# Patient Record
Sex: Female | Born: 1975 | Race: White | Hispanic: Yes | Marital: Married | Smoking: Never smoker
Health system: Southern US, Community
[De-identification: ages and names within clinical notes are randomized; demographics above are authoritative.]

## PROBLEM LIST (undated history)

## (undated) ENCOUNTER — Emergency Department (HOSPITAL_COMMUNITY): Discharge status: Against Medical Advice | Payer: Self-pay

## (undated) DIAGNOSIS — Z789 Other specified health status: Secondary | ICD-10-CM

## (undated) HISTORY — PX: OTHER SURGICAL HISTORY: SHX169

---

## 2012-05-28 ENCOUNTER — Encounter (HOSPITAL_COMMUNITY): Payer: Self-pay | Admitting: *Deleted

## 2012-06-02 ENCOUNTER — Encounter (HOSPITAL_COMMUNITY): Payer: Self-pay

## 2012-06-02 ENCOUNTER — Other Ambulatory Visit: Payer: Self-pay | Admitting: Obstetrics and Gynecology

## 2012-06-02 ENCOUNTER — Ambulatory Visit (HOSPITAL_COMMUNITY)
Admission: RE | Admit: 2012-06-02 | Discharge: 2012-06-02 | Disposition: A | Discharge status: Home / Self Care | Payer: Self-pay | Source: Ambulatory Visit | Attending: Obstetrics and Gynecology | Admitting: Obstetrics and Gynecology

## 2012-06-02 VITALS — BP 96/54 | Temp 97.9°F | Ht 59.0 in | Wt 118.6 lb

## 2012-06-02 DIAGNOSIS — N6321 Unspecified lump in the left breast, upper outer quadrant: Secondary | ICD-10-CM

## 2012-06-02 DIAGNOSIS — Z1239 Encounter for other screening for malignant neoplasm of breast: Secondary | ICD-10-CM

## 2012-06-02 NOTE — Patient Instructions (Signed)
Taught patient how to perform BSE. Patient did not need a Pap smear today due to last Pap smear was 05/15/2012. Let her know [facility]CP will cover Pap smears every 3 years unless has a history of abnormal Pap smears. Referred patient to the [facility] for diagnostic mammogram and possible left breast ultrasound. Appointment scheduled for Monday, June 15, 2012 at 1010. Patient aware of appointment and will be there. Patient verbalized understanding.

## 2012-06-02 NOTE — Addendum Note (Signed)
Encounter addended by: Saintclair Halsted, RN on: 06/02/2012  4:35 PM<BR>     Documentation filed: Visit Diagnoses

## 2012-06-02 NOTE — Progress Notes (Addendum)
Patient complained of upper outer quadrant tender breast lump. Patient rates pain at a 5 out of 10.  Pap Smear:    Pap smear not completed today. Last Pap smear was 05/15/2012 at the [facility] and normal. Per patient no history of abnormal Pap smears. No Pap smear results in EPIC.  Physical exam: Breasts Breasts symmetrical. No skin abnormalities bilateral breasts. No nipple retraction right breast. Left nipple retracted per patient is normal for her. No nipple discharge bilateral breasts. No lymphadenopathy. No lumps palpated right breast. Palpated lump within the left breast at 2 o'clock 2 cm from the areola.  Patient complained of tenderness when palpated lump. Referred patient to the [facility] for diagnostic mammogram and possible left breast ultrasound. Appointment scheduled for Monday, June 15, 2012 at 1010.     Pelvic/Bimanual No Pap smear completed today since last Pap smear was 05/15/2012. Pap smear not indicated per BCCCP guidelines.

## 2012-06-15 ENCOUNTER — Ambulatory Visit
Admission: RE | Admit: 2012-06-15 | Discharge: 2012-06-15 | Disposition: A | Discharge status: Home / Self Care | Payer: No Typology Code available for payment source | Source: Ambulatory Visit | Attending: Obstetrics and Gynecology | Admitting: Obstetrics and Gynecology

## 2012-06-15 DIAGNOSIS — N6321 Unspecified lump in the left breast, upper outer quadrant: Secondary | ICD-10-CM

## 2012-10-19 ENCOUNTER — Encounter (HOSPITAL_COMMUNITY): Payer: Self-pay | Admitting: Emergency Medicine

## 2012-10-19 ENCOUNTER — Emergency Department (HOSPITAL_COMMUNITY): Payer: Self-pay

## 2012-10-19 ENCOUNTER — Emergency Department (HOSPITAL_COMMUNITY)
Admission: EM | Admit: 2012-10-19 | Discharge: 2012-10-19 | Disposition: A | Discharge status: Home / Self Care | Payer: Self-pay | Attending: Emergency Medicine | Admitting: Emergency Medicine

## 2012-10-19 DIAGNOSIS — Z3202 Encounter for pregnancy test, result negative: Secondary | ICD-10-CM | POA: Insufficient documentation

## 2012-10-19 DIAGNOSIS — N83209 Unspecified ovarian cyst, unspecified side: Secondary | ICD-10-CM | POA: Insufficient documentation

## 2012-10-19 LAB — WET PREP, GENITAL: Yeast Wet Prep HPF POC: NONE SEEN

## 2012-10-19 LAB — URINE MICROSCOPIC-ADD ON

## 2012-10-19 LAB — URINALYSIS, ROUTINE W REFLEX MICROSCOPIC
Bilirubin Urine: NEGATIVE
Hgb urine dipstick: NEGATIVE
Protein, ur: NEGATIVE mg/dL
Urobilinogen, UA: 0.2 mg/dL (ref 0.0–1.0)

## 2012-10-19 MED ORDER — IBUPROFEN 600 MG PO TABS: 600.0000 mg | ORAL_TABLET | Freq: Four times a day (QID) | ORAL | Status: DC | PRN

## 2012-10-19 MED ORDER — OXYCODONE-ACETAMINOPHEN 5-325 MG PO TABS
1.0000 | ORAL_TABLET | Freq: Once | ORAL | Status: AC
  Administered 2012-10-19: 1 via ORAL
  Filled 2012-10-19: qty 1

## 2012-10-19 NOTE — ED Notes (Addendum)
1610  Received report and introduced self to the pt.  2035  MD in room with female chaperone for pelvic exam

## 2012-10-19 NOTE — ED Notes (Signed)
Pt states through translator that she has requested to have her IUD taken out in the past, but was told "they" could not because she might get pregnant.

## 2012-10-19 NOTE — ED Notes (Signed)
Patient transported to CT 

## 2012-10-19 NOTE — ED Notes (Signed)
Interpreter phone used: IUD placed in [place] 5 years ago. For 3 days intense pain in pelvis and bleeding. Reports it was more abundant in last few days and bleeding has tapered off. Denies possibility she could be pregnant.

## 2012-10-19 NOTE — ED Provider Notes (Signed)
I saw and evaluated the patient, reviewed the resident's note and I agree with the findings and plan.  Pt with non surgical abd, f/u given  Toy Baker, MD 10/19/12 2118

## 2012-10-19 NOTE — ED Provider Notes (Signed)
I saw and evaluated the patient, reviewed the resident's note and I agree with the findings and plan.  Apolonio Cutting T Jon Lall, MD 10/19/12 2305 

## 2012-10-19 NOTE — ED Notes (Signed)
Patient returned from CT

## 2012-10-19 NOTE — ED Provider Notes (Signed)
History    CSN: [id number] Arrival date & time 10/19/12  1349  First MD Initiated Contact with Patient 10/19/12 1855     Chief Complaint  Patient presents with  . Abdominal Pain   (Consider location/radiation/quality/duration/timing/severity/associated sxs/prior Treatment) Patient is a 37 y.o. female presenting with abdominal pain. The history is provided by the patient. The history is limited by a language barrier. A language interpreter was used.  Abdominal Pain Associated symptoms include abdominal pain. Associated symptoms comments: 37yo female who had an IUD placed 5 years ago in [place].  Has had regular annual check ups, including a normal one by her OB/GYN 2 months ago. She presents with abd pain for the past 3 days, along with vaginal bleeding.  States the pain is burning sensation along BLQ, constant without radiation.  She has had dysuria as well.  No vaginal discharge.  She uses 1-2 pads per day, when she normally does not bleed at all.  She denies any new unprotected sex with new partners.  ROS is otherwise negative..  10 Systems reviewed and are negative for acute change except as noted in the HPI.  No past medical history on file. No past surgical history on file. No family history on file. History  Substance Use Topics  . Smoking status: Not on file  . Smokeless tobacco: Not on file  . Alcohol Use: Not on file   OB History   Grav Para Term Preterm Abortions TAB SAB Ect Mult Living                 Review of Systems  Gastrointestinal: Positive for abdominal pain.    Allergies  Review of patient's allergies indicates no known allergies.  Home Medications  No current outpatient prescriptions on file. BP 93/64  Pulse 65  Temp(Src) 97.6 F (36.4 C) (Oral)  Resp 18  SpO2 100%  LMP 09/29/2012 Physical Exam  Nursing note and vitals reviewed. Constitutional: She is oriented to person, place, and time. She appears well-developed and well-nourished. No distress.   HENT:  Head: Normocephalic and atraumatic.  Eyes: Conjunctivae and EOM are normal. Pupils are equal, round, and reactive to light. No scleral icterus.  Neck: Normal range of motion. Neck supple. No JVD present. No tracheal deviation present. No thyromegaly present.  Cardiovascular: Normal rate, regular rhythm and normal heart sounds.  Exam reveals no gallop and no friction rub.   No murmur heard. Pulmonary/Chest: Effort normal and breath sounds normal. No respiratory distress. She has no wheezes. She exhibits no tenderness.  Abdominal: Soft. Bowel sounds are normal. She exhibits no distension and no mass. There is no hepatosplenomegaly. There is tenderness in the right lower quadrant, suprapubic area and left lower quadrant. There is guarding. There is no rigidity, no rebound, no CVA tenderness, no tenderness at McBurney's point and negative Murphy's sign. No hernia.  Genitourinary: Vagina normal. There is no rash, tenderness, lesion or injury on the right labia. There is no rash, tenderness, lesion or injury on the left labia. No vaginal discharge found.  IUD strings present, coming from the cervical os  Musculoskeletal: Normal range of motion. She exhibits no edema and no tenderness.  Lymphadenopathy:    She has no cervical adenopathy.  Neurological: She is alert and oriented to person, place, and time.  Skin: Skin is warm and dry. No rash noted. No erythema. No pallor.     ED Course  Procedures (including critical care time) Labs Reviewed  URINALYSIS, ROUTINE W REFLEX MICROSCOPIC -  Abnormal; Notable for the following:    Specific Gravity, Urine 1.004 (*)    Leukocytes, UA SMALL (*)    All other components within normal limits  WET PREP, GENITAL  GC/CHLAMYDIA PROBE AMP  URINE MICROSCOPIC-ADD ON  POCT PREGNANCY, URINE   No results found. No diagnosis found.  MDM  Patient was given percocet for her pain.  She will be evaluated with [place] and a pelvic exam to rule out PID.  She was  told we can not remove IUDs in the ED and she needs to go to her OB/GYN doctor for continued evaluation.  Patient is amendable with this plan.  She was given phone number to our OB/GYN doctor for IUD removal and educated on hemorraghic cyst.  Patient amendable with this plan.  Tomasita Crumble, MD 10/19/12 2121

## 2013-01-25 ENCOUNTER — Other Ambulatory Visit (HOSPITAL_COMMUNITY): Payer: Self-pay | Admitting: Nurse Practitioner

## 2013-01-25 DIAGNOSIS — Z3689 Encounter for other specified antenatal screening: Secondary | ICD-10-CM

## 2013-01-25 DIAGNOSIS — Z0489 Encounter for examination and observation for other specified reasons: Secondary | ICD-10-CM

## 2013-01-25 LAB — OB RESULTS CONSOLE HIV ANTIBODY (ROUTINE TESTING): HIV: NONREACTIVE

## 2013-01-25 LAB — OB RESULTS CONSOLE ABO/RH: RH TYPE: POSITIVE

## 2013-01-25 LAB — OB RESULTS CONSOLE RUBELLA ANTIBODY, IGM: Rubella: NON-IMMUNE/NOT IMMUNE

## 2013-01-25 LAB — OB RESULTS CONSOLE RPR: RPR: NONREACTIVE

## 2013-01-25 LAB — OB RESULTS CONSOLE ANTIBODY SCREEN: Antibody Screen: NEGATIVE

## 2013-01-25 LAB — OB RESULTS CONSOLE HEPATITIS B SURFACE ANTIGEN: Hepatitis B Surface Ag: NEGATIVE

## 2013-02-08 ENCOUNTER — Ambulatory Visit (HOSPITAL_COMMUNITY)
Admission: RE | Admit: 2013-02-08 | Discharge: 2013-02-08 | Disposition: A | Discharge status: Home / Self Care | Payer: Medicaid Other | Source: Ambulatory Visit | Attending: Nurse Practitioner | Admitting: Nurse Practitioner

## 2013-02-08 ENCOUNTER — Other Ambulatory Visit (HOSPITAL_COMMUNITY): Payer: Self-pay | Admitting: Nurse Practitioner

## 2013-02-08 DIAGNOSIS — Z3689 Encounter for other specified antenatal screening: Secondary | ICD-10-CM

## 2013-02-08 DIAGNOSIS — Z1389 Encounter for screening for other disorder: Secondary | ICD-10-CM | POA: Insufficient documentation

## 2013-02-08 DIAGNOSIS — O358XX0 Maternal care for other (suspected) fetal abnormality and damage, not applicable or unspecified: Secondary | ICD-10-CM | POA: Insufficient documentation

## 2013-02-08 DIAGNOSIS — Z363 Encounter for antenatal screening for malformations: Secondary | ICD-10-CM | POA: Insufficient documentation

## 2013-02-08 DIAGNOSIS — O09529 Supervision of elderly multigravida, unspecified trimester: Secondary | ICD-10-CM | POA: Insufficient documentation

## 2013-02-22 ENCOUNTER — Other Ambulatory Visit (HOSPITAL_COMMUNITY): Payer: Self-pay | Admitting: Nurse Practitioner

## 2013-02-22 DIAGNOSIS — Z0489 Encounter for examination and observation for other specified reasons: Secondary | ICD-10-CM

## 2013-03-10 ENCOUNTER — Ambulatory Visit (HOSPITAL_COMMUNITY): Admission: RE | Admit: 2013-03-10 | Payer: Medicaid Other | Source: Ambulatory Visit

## 2013-03-10 ENCOUNTER — Ambulatory Visit (HOSPITAL_COMMUNITY)
Admission: RE | Admit: 2013-03-10 | Discharge: 2013-03-10 | Disposition: A | Discharge status: Home / Self Care | Payer: Medicaid Other | Source: Ambulatory Visit | Attending: Nurse Practitioner | Admitting: Nurse Practitioner

## 2013-03-10 DIAGNOSIS — Z3689 Encounter for other specified antenatal screening: Secondary | ICD-10-CM | POA: Insufficient documentation

## 2013-03-10 DIAGNOSIS — Z0489 Encounter for examination and observation for other specified reasons: Secondary | ICD-10-CM

## 2013-03-10 DIAGNOSIS — O34219 Maternal care for unspecified type scar from previous cesarean delivery: Secondary | ICD-10-CM | POA: Insufficient documentation

## 2013-03-10 DIAGNOSIS — O09529 Supervision of elderly multigravida, unspecified trimester: Secondary | ICD-10-CM | POA: Insufficient documentation

## 2013-06-02 ENCOUNTER — Encounter (HOSPITAL_COMMUNITY): Payer: Self-pay

## 2013-07-21 ENCOUNTER — Encounter (HOSPITAL_COMMUNITY): Payer: Self-pay

## 2013-07-22 ENCOUNTER — Inpatient Hospital Stay (HOSPITAL_COMMUNITY): Admission: RE | Admit: 2013-07-22 | Payer: Medicaid Other | Source: Ambulatory Visit

## 2013-07-26 ENCOUNTER — Encounter (HOSPITAL_COMMUNITY): Payer: Medicaid Other | Admitting: Anesthesiology

## 2013-07-26 ENCOUNTER — Encounter (HOSPITAL_COMMUNITY)
Admission: AD | Disposition: A | Discharge status: Home / Self Care | Payer: Self-pay | Source: Ambulatory Visit | Attending: Obstetrics & Gynecology

## 2013-07-26 ENCOUNTER — Inpatient Hospital Stay (HOSPITAL_COMMUNITY): Admission: RE | Admit: 2013-07-26 | Payer: Self-pay | Source: Ambulatory Visit | Admitting: Obstetrics & Gynecology

## 2013-07-26 ENCOUNTER — Inpatient Hospital Stay (HOSPITAL_COMMUNITY)
Admission: AD | Admit: 2013-07-26 | Discharge: 2013-07-28 | DRG: 766 | Disposition: A | Discharge status: Home / Self Care | Payer: Medicaid Other | Source: Ambulatory Visit | Attending: Obstetrics & Gynecology | Admitting: Obstetrics & Gynecology

## 2013-07-26 ENCOUNTER — Encounter (HOSPITAL_COMMUNITY): Payer: Self-pay | Admitting: *Deleted

## 2013-07-26 ENCOUNTER — Inpatient Hospital Stay (HOSPITAL_COMMUNITY): Payer: Medicaid Other | Admitting: Anesthesiology

## 2013-07-26 ENCOUNTER — Encounter (HOSPITAL_COMMUNITY): Payer: Self-pay

## 2013-07-26 ENCOUNTER — Encounter (HOSPITAL_COMMUNITY)
Admission: RE | Admit: 2013-07-26 | Discharge: 2013-07-26 | Disposition: A | Discharge status: Home / Self Care | Payer: Medicaid Other | Source: Ambulatory Visit | Attending: Obstetrics & Gynecology | Admitting: Obstetrics & Gynecology

## 2013-07-26 DIAGNOSIS — Z98891 History of uterine scar from previous surgery: Secondary | ICD-10-CM

## 2013-07-26 DIAGNOSIS — O09529 Supervision of elderly multigravida, unspecified trimester: Secondary | ICD-10-CM | POA: Diagnosis present

## 2013-07-26 DIAGNOSIS — IMO0001 Reserved for inherently not codable concepts without codable children: Secondary | ICD-10-CM

## 2013-07-26 DIAGNOSIS — O34219 Maternal care for unspecified type scar from previous cesarean delivery: Secondary | ICD-10-CM

## 2013-07-26 DIAGNOSIS — O212 Late vomiting of pregnancy: Secondary | ICD-10-CM | POA: Diagnosis not present

## 2013-07-26 DIAGNOSIS — Z302 Encounter for sterilization: Secondary | ICD-10-CM

## 2013-07-26 DIAGNOSIS — O321XX0 Maternal care for breech presentation, not applicable or unspecified: Secondary | ICD-10-CM | POA: Diagnosis present

## 2013-07-26 DIAGNOSIS — Z01812 Encounter for preprocedural laboratory examination: Secondary | ICD-10-CM | POA: Insufficient documentation

## 2013-07-26 DIAGNOSIS — O479 False labor, unspecified: Secondary | ICD-10-CM | POA: Diagnosis present

## 2013-07-26 HISTORY — DX: Other specified health status: Z78.9

## 2013-07-26 LAB — CBC
HEMATOCRIT: 35.7 % — AB (ref 36.0–46.0)
Hemoglobin: 12 g/dL (ref 12.0–15.0)
MCH: 30.5 pg (ref 26.0–34.0)
MCHC: 33.6 g/dL (ref 30.0–36.0)
MCV: 90.8 fL (ref 78.0–100.0)
PLATELETS: 180 10*3/uL (ref 150–400)
RBC: 3.93 MIL/uL (ref 3.87–5.11)
RDW: 13.8 % (ref 11.5–15.5)
WBC: 7 10*3/uL (ref 4.0–10.5)

## 2013-07-26 LAB — TYPE AND SCREEN
ABO/RH(D): A POS
Antibody Screen: NEGATIVE

## 2013-07-26 LAB — ABO/RH: ABO/RH(D): A POS

## 2013-07-26 SURGERY — Surgical Case
Anesthesia: Spinal | Site: Abdomen | Laterality: Bilateral

## 2013-07-26 MED ORDER — KETOROLAC TROMETHAMINE 30 MG/ML IJ SOLN
INTRAMUSCULAR | Status: AC
  Administered 2013-07-26: 30 mg via INTRAVENOUS
  Filled 2013-07-26: qty 1

## 2013-07-26 MED ORDER — LACTATED RINGERS IV BOLUS (SEPSIS)
1000.0000 mL | Freq: Once | INTRAVENOUS | Status: AC
Start: 2013-07-26 — End: 2013-07-26
  Administered 2013-07-26: 1000 mL via INTRAVENOUS

## 2013-07-26 MED ORDER — PROMETHAZINE HCL 25 MG/ML IJ SOLN: 6.2500 mg | INTRAMUSCULAR | Status: DC | PRN

## 2013-07-26 MED ORDER — BUPIVACAINE HCL (PF) 0.5 % IJ SOLN
INTRAMUSCULAR | Status: DC | PRN
  Administered 2013-07-26: 30 mL

## 2013-07-26 MED ORDER — FENTANYL CITRATE 0.05 MG/ML IJ SOLN
INTRAMUSCULAR | Status: DC | PRN
  Administered 2013-07-26: 12.5 ug via INTRATHECAL

## 2013-07-26 MED ORDER — KETOROLAC TROMETHAMINE 30 MG/ML IJ SOLN
30.0000 mg | Freq: Four times a day (QID) | INTRAMUSCULAR | Status: AC | PRN
  Administered 2013-07-26: 30 mg via INTRAVENOUS

## 2013-07-26 MED ORDER — ONDANSETRON HCL 4 MG/2ML IJ SOLN
INTRAMUSCULAR | Status: DC | PRN
  Administered 2013-07-26: 4 mg via INTRAVENOUS

## 2013-07-26 MED ORDER — CEFAZOLIN SODIUM-DEXTROSE 2-3 GM-% IV SOLR
2.0000 g | Freq: Once | INTRAVENOUS | Status: AC
  Administered 2013-07-26: 2 g via INTRAVENOUS

## 2013-07-26 MED ORDER — SCOPOLAMINE 1 MG/3DAYS TD PT72
MEDICATED_PATCH | TRANSDERMAL | Status: AC
  Filled 2013-07-26: qty 1

## 2013-07-26 MED ORDER — LACTATED RINGERS IV SOLN
INTRAVENOUS | Status: DC
  Administered 2013-07-26 (×2): via INTRAVENOUS

## 2013-07-26 MED ORDER — OXYTOCIN 10 UNIT/ML IJ SOLN
40.0000 [IU] | INTRAVENOUS | Status: DC | PRN
  Administered 2013-07-26: 40 [IU] via INTRAVENOUS

## 2013-07-26 MED ORDER — HYDROMORPHONE HCL PF 1 MG/ML IJ SOLN: 0.2500 mg | INTRAMUSCULAR | Status: DC | PRN

## 2013-07-26 MED ORDER — MORPHINE SULFATE (PF) 0.5 MG/ML IJ SOLN
INTRAMUSCULAR | Status: DC | PRN
  Administered 2013-07-26: .2 mg via INTRATHECAL

## 2013-07-26 MED ORDER — BUPIVACAINE IN DEXTROSE 0.75-8.25 % IT SOLN
INTRATHECAL | Status: DC | PRN
  Administered 2013-07-26: 1.2 mL via INTRATHECAL

## 2013-07-26 MED ORDER — MEPERIDINE HCL 25 MG/ML IJ SOLN: 6.2500 mg | INTRAMUSCULAR | Status: DC | PRN

## 2013-07-26 MED ORDER — LACTATED RINGERS IV SOLN
INTRAVENOUS | Status: DC
Start: 2013-07-26 — End: 2013-07-27
  Administered 2013-07-26: 20:00:00 via INTRAVENOUS

## 2013-07-26 MED ORDER — SCOPOLAMINE 1 MG/3DAYS TD PT72
1.0000 | MEDICATED_PATCH | Freq: Once | TRANSDERMAL | Status: DC
  Administered 2013-07-26: 1.5 mg via TRANSDERMAL

## 2013-07-26 MED ORDER — CITRIC ACID-SODIUM CITRATE 334-500 MG/5ML PO SOLN
30.0000 mL | Freq: Once | ORAL | Status: AC
  Administered 2013-07-26: 30 mL via ORAL
  Filled 2013-07-26: qty 15

## 2013-07-26 MED ORDER — FAMOTIDINE IN NACL 20-0.9 MG/50ML-% IV SOLN
20.0000 mg | Freq: Once | INTRAVENOUS | Status: AC
  Administered 2013-07-26: 20 mg via INTRAVENOUS
  Filled 2013-07-26: qty 50

## 2013-07-26 MED ORDER — PHENYLEPHRINE 8 MG IN D5W 100 ML (0.08MG/ML) PREMIX OPTIME
INJECTION | INTRAVENOUS | Status: DC | PRN
  Administered 2013-07-26: 60 ug/min via INTRAVENOUS

## 2013-07-26 MED ORDER — KETOROLAC TROMETHAMINE 30 MG/ML IJ SOLN: 30.0000 mg | Freq: Four times a day (QID) | INTRAMUSCULAR | Status: AC | PRN

## 2013-07-26 SURGICAL SUPPLY — 37 items
BINDER ABD UNIV 10 28-50 (GAUZE/BANDAGES/DRESSINGS) IMPLANT
BINDER ABD UNIV 12 45-62 (WOUND CARE) IMPLANT
BINDER ABDOM UNIV 10 (GAUZE/BANDAGES/DRESSINGS)
BINDER ABDOMINAL 46IN 62IN (WOUND CARE)
CLAMP CORD UMBIL (MISCELLANEOUS) IMPLANT
CLOTH BEACON ORANGE TIMEOUT ST (SAFETY) ×3 IMPLANT
DRAPE LG THREE QUARTER DISP (DRAPES) IMPLANT
DRSG OPSITE POSTOP 4X10 (GAUZE/BANDAGES/DRESSINGS) ×3 IMPLANT
DURAPREP 26ML APPLICATOR (WOUND CARE) ×3 IMPLANT
ELECT REM PT RETURN 9FT ADLT (ELECTROSURGICAL) ×3
ELECTRODE REM PT RTRN 9FT ADLT (ELECTROSURGICAL) ×1 IMPLANT
EXTRACTOR VACUUM KIWI (MISCELLANEOUS) IMPLANT
GLOVE BIO SURGEON STRL SZ7 (GLOVE) ×3 IMPLANT
GLOVE BIOGEL PI IND STRL 7.0 (GLOVE) ×1 IMPLANT
GLOVE BIOGEL PI INDICATOR 7.0 (GLOVE) ×2
GOWN STRL NON-REIN LRG LVL3 (GOWN DISPOSABLE) ×3 IMPLANT
GOWN STRL REUS W/TWL LRG LVL3 (GOWN DISPOSABLE) ×6 IMPLANT
KIT ABG SYR 3ML LUER SLIP (SYRINGE) IMPLANT
NEEDLE HYPO 22GX1.5 SAFETY (NEEDLE) ×3 IMPLANT
NEEDLE HYPO 25X5/8 SAFETYGLIDE (NEEDLE) IMPLANT
NS IRRIG 1000ML POUR BTL (IV SOLUTION) ×3 IMPLANT
PACK C SECTION WH (CUSTOM PROCEDURE TRAY) ×3 IMPLANT
PAD OB MATERNITY 4.3X12.25 (PERSONAL CARE ITEMS) ×3 IMPLANT
RTRCTR C-SECT PINK 25CM LRG (MISCELLANEOUS) IMPLANT
SPONGE SURGIFOAM ABS GEL 12-7 (HEMOSTASIS) IMPLANT
STAPLER VISISTAT 35W (STAPLE) IMPLANT
SUT PDS AB 0 CTX 60 (SUTURE) IMPLANT
SUT PLAIN 0 NONE (SUTURE) IMPLANT
SUT SILK 0 TIES 10X30 (SUTURE) IMPLANT
SUT VIC AB 0 CT1 36 (SUTURE) ×9 IMPLANT
SUT VIC AB 3-0 CT1 27 (SUTURE) ×2
SUT VIC AB 3-0 CT1 TAPERPNT 27 (SUTURE) ×1 IMPLANT
SUT VIC AB 4-0 KS 27 (SUTURE) IMPLANT
SYR CONTROL 10ML LL (SYRINGE) ×3 IMPLANT
TOWEL OR 17X24 6PK STRL BLUE (TOWEL DISPOSABLE) ×3 IMPLANT
TRAY FOLEY CATH 14FR (SET/KITS/TRAYS/PACK) ×3 IMPLANT
WATER STERILE IRR 1000ML POUR (IV SOLUTION) IMPLANT

## 2013-07-26 NOTE — MAU Note (Signed)
Patient is scheduled for a repeat cesarean section tomorrow. Was in pre op appointment and told RN she was having contractions. To MAU for evaluation.

## 2013-07-26 NOTE — Anesthesia Postprocedure Evaluation (Signed)
Anesthesia Post Note  Patient: Linda Costa  Procedure(s) Performed: Procedure(s) (LRB): REPEAT CESAREAN SECTION WITH BILATERAL TUBAL LIGATION (Bilateral)  Anesthesia type: Spinal  Patient location: PACU  Post pain: Pain level controlled  Post assessment: Post-op Vital signs reviewed  Last Vitals:  Filed Vitals:   07/26/13 2215  BP: 95/56  Pulse: 66  Temp:   Resp: 20    Post vital signs: Reviewed  Level of consciousness: awake  Complications: No apparent anesthesia complications

## 2013-07-26 NOTE — Op Note (Signed)
Attestation of Attending Supervision of Fellow: Evaluation and management procedures were performed by the Fellow under my supervision and collaboration.  I have reviewed the Fellow's note and chart, and I agree with the management and plan.    

## 2013-07-26 NOTE — H&P (Signed)
LABOR ADMISSION HISTORY AND PHYSICAL   Linda Costa is a 38 y.o. female [phone] with IUP at [redacted]w[redacted]d presenting for contractions with a scheduled c/s in the AM.  States has been contracting since last night. Worsened in the last few hours.  +FM, no LOF, no VB.    Hx of 2 prior c/s both for failure to dilate from closed.   [facility] since 1st trimester.  Uncomplicated.  1 hour elevated but normal 3 hour.  GBS neg. [place] normal.  Prenatal History/Complications:  Past Medical History: Past Medical History  Diagnosis Date  . Medical history non-contributory     Past Surgical History: Past Surgical History  Procedure Laterality Date  . C/s x3    . Cesarean section      Obstetrical History: OB History   Grav Para Term Preterm Abortions TAB SAB Ect Mult Living   4 2 2  1  1   2         Social History: History   Social History  . Marital Status: Married    Spouse Name: N/A    Number of Children: N/A  . Years of Education: N/A   Social History Main Topics  . Smoking status: Never Smoker   . Smokeless tobacco: None  . Alcohol Use: No  . Drug Use: No  . Sexual Activity: Yes    Birth Control/ Protection: Surgical   Other Topics Concern  . None   Social History Narrative   ** Merged History Encounter **        Family History: Family History  Problem Relation Age of Onset  . Alcohol abuse Neg Hx   . Arthritis Neg Hx   . Asthma Neg Hx   . Birth defects Neg Hx   . Cancer Neg Hx   . COPD Neg Hx   . Depression Neg Hx   . Diabetes Neg Hx   . Drug abuse Neg Hx   . Early death Neg Hx   . Hearing loss Neg Hx   . Heart disease Neg Hx   . Hyperlipidemia Neg Hx   . Hypertension Neg Hx   . Kidney disease Neg Hx   . Learning disabilities Neg Hx   . Mental illness Neg Hx   . Mental retardation Neg Hx   . Miscarriages / Stillbirths Neg Hx   . Stroke Neg Hx   . Vision loss Neg Hx   . Varicose Veins Neg Hx     Allergies: No Known Allergies  No  prescriptions prior to admission     Review of Systems  All systems reviewed and negative except as stated in HPI    Blood pressure 107/70, pulse 84, temperature 97.7 F (36.5 C), temperature source Oral, resp. rate 18, height 4\' 11"  (1.499 m), weight 68.04 kg (150 lb), last menstrual period 09/29/2012. General appearance: alert, cooperative and appears stated age, uncomfortable with contractions Lungs: clear to auscultation bilaterally Heart: regular rate and rhythm Abdomen: soft, non-tender; bowel sounds normal Pelvic: 0/thick/high Extremities: Homans sign is negative, no sign of DVT  Fetal monitoringBaseline: 135 bpm, Variability: Good {> 6 bpm), Accelerations: Reactive and Decelerations: Absent Uterine activity every 3 min    Dilation: Closed Exam by:: Morrison Old RN   Prenatal labs: ABO, Rh: A/Positive/-- (10/06 1101) Antibody: Negative (10/06 1101) Rubella:   RPR: Nonreactive (10/06 1101)  HBsAg: Negative (10/06 1101)  HIV: Non-reactive (10/06 1101)  GBS:    1 hr Glucola 144- normal 3 hour  Genetic  screening  normal Anatomy US normal     Assessment: Linda Costa is a 38 y.o. [id number] with an IUP at 2951w6d presenting for early labor and hx of 2 prior c/s.   Plan: - pt here in early labor and appearing uncomfortable with contractions and breathing through them.  - will plan for c/s tonight with BTL per pt request - discussed with pt - has been NPO since 12 so will plan for c/s at 8pm  The risks of cesarean section discussed with the patient included but were not limited to: bleeding which may require transfusion or reoperation; infection which may require antibiotics; injury to bowel, bladder, ureters or other surrounding organs; injury to the fetus; need for additional procedures including hysterectomy in the event of a life-threatening hemorrhage; placental abnormalities wth subsequent pregnancies, incisional problems, thromboembolic phenomenon and other  postoperative/anesthesia complications. The patient concurred with the proposed plan, giving informed written consent for the procedure.   Patient has been NPO since 12 she will remain NPO for procedure. Anesthesia and OR aware. Preoperative prophylactic antibiotics and SCDs ordered on call to the OR.  To OR when ready.  Patient desires permanent sterilization.  Other reversible forms of contraception were discussed with patient; she declines all other modalities. Risks of procedure discussed with patient including but not limited to: risk of regret, permanence of method, bleeding, infection, injury to surrounding organs and need for additional procedures.  Failure risk of 1-2 % with increased risk of ectopic gestation if pregnancy occurs was also discussed with patient.  Patient verbalized understanding of these risks and wants to proceed with sterilization.  Written informed consent obtained.  To OR when ready.    Vale HavenBECK, Bayleigh Loflin L, MD 07/26/2013, 6:07 PM

## 2013-07-26 NOTE — Anesthesia Preprocedure Evaluation (Signed)
Anesthesia Evaluation  Patient identified by MRN, date of birth, ID band Patient awake    Reviewed: Allergy & Precautions, H&P , NPO status , Patient's Chart, lab work & pertinent test results  Airway Mallampati: II  TM Distance: >3 FB Neck ROM: full    Dental no notable dental hx.    Pulmonary neg pulmonary ROS,    Pulmonary exam normal       Cardiovascular negative cardio ROS      Neuro/Psych negative neurological ROS  negative psych ROS   GI/Hepatic negative GI ROS, Neg liver ROS,   Endo/Other  negative endocrine ROS  Renal/GU negative Renal ROS     Musculoskeletal   Abdominal Normal abdominal exam  (+)   Peds  Hematology negative hematology ROS (+)   Anesthesia Other Findings   Reproductive/Obstetrics (+) Pregnancy                             Anesthesia Physical Anesthesia Plan  ASA: II  Anesthesia Plan: Spinal   Post-op Pain Management:    Induction:   Airway Management Planned:   Additional Equipment:   Intra-op Plan:   Post-operative Plan:   Informed Consent: I have reviewed the patients History and Physical, chart, labs and discussed the procedure including the risks, benefits and alternatives for the proposed anesthesia with the patient or authorized representative who has indicated his/her understanding and acceptance.     Plan Discussed with: CRNA and Surgeon  Anesthesia Plan Comments:         Anesthesia Quick Evaluation  

## 2013-07-26 NOTE — Patient Instructions (Signed)
20 Linda Costa  07/26/2013   Your procedure is scheduled on:  07/27/13  [facility] through [facility] at 8 AM.  Pick up the phone at the desk and dial [phone].   Call this number if you have problems the morning of surgery: [phone]   Remember:   Do not eat food:After Midnight.  Do not drink clear liquids: After Midnight.  Take these medicines the morning of surgery with A SIP OF WATER: NA   Do not wear jewelry, make-up or nail polish.  Do not wear lotions, powders, or perfumes. You may wear deodorant.  Do not shave 48 hours prior to surgery.  Do not bring valuables to the hospital.  [facility] is not   responsible for any belongings or valuables brought to the hospital.  Contacts, dentures or bridgework may not be worn into surgery.  Leave suitcase in the car. After surgery it may be brought to your room.  For patients admitted to the hospital, checkout time is 11:00 AM the day of              discharge.   Patients discharged the day of surgery will not be allowed to drive             home.  Name and phone number of your driver: Na  Special Instructions:      Please read over the following fact sheets that you were given:   Surgical Site Infection Prevention

## 2013-07-26 NOTE — H&P (Signed)
Attestation of Attending Supervision of Advanced Practitioner (CNM/NP): Evaluation and management procedures were performed by the Advanced Practitioner under my supervision and collaboration.  I have reviewed the Advanced Practitioner's note and chart, and I agree with the management and plan.  HARRAWAY-SMITH, Allena Pietila 10:21 PM     

## 2013-07-26 NOTE — Pre-Procedure Instructions (Signed)
Pt states via interpreter that she is having contractions every 5-10 min. Denies leakage of fluid or active bleeding. Dr Erin FullingHarraway Smith notified and order given to send patient to MAU for monitoring. Pt escorted to MAU with husband. [facility].

## 2013-07-26 NOTE — Op Note (Signed)
Linda Costa PROCEDURE DATE: 07/26/2013  PREOPERATIVE DIAGNOSIS: Intrauterine pregnancy at  8980w6d weeks gestation; 2 previous cesarean sections, early labor with regular contractions; desire for permanent sterilization  POSTOPERATIVE DIAGNOSIS: The same  PROCEDURE: Repeat Low Transverse Cesarean Section with partial salpingectomy  SURGEON:  Dr. Eber Jonesarolyn L. Harraway-Smith  ASSISTANT:  Dr. Rulon AbideKeli Neeka Urista   INDICATIONS: Linda Costa is a 38 y.o. [phone] [id number] at 5180w6d here for cesarean section secondary to the indications listed under preoperative diagnosis; please see preoperative note for further details.  The risks of cesarean section were discussed with the patient including but were not limited to: bleeding which may require transfusion or reoperation; infection which may require antibiotics; injury to bowel, bladder, ureters or other surrounding organs; injury to the fetus; need for additional procedures including hysterectomy in the event of a life-threatening hemorrhage; placental abnormalities wth subsequent pregnancies, incisional problems, thromboembolic phenomenon and other postoperative/anesthesia complications.   The patient concurred with the proposed plan, giving informed written consent for the procedure.   Patient also desires permanent sterilization.  Other reversible forms of contraception were discussed with patient; she declines all other modalities. Risks of procedure discussed with patient including but not limited to: risk of regret, permanence of method, bleeding, infection, injury to surrounding organs and need for additional procedures.  Failure risk of 1-2% with increased risk of ectopic gestation if pregnancy occurs was also discussed with patient.  The patient concurred with the proposed plan, giving informed written consent for the procedures.   FINDINGS:  Viable female infant in breech presentation.  Apgars 8 and 9.  Clear amniotic fluid.  Intact placenta, three vessel  cord.  Normal uterus, fallopian tubes and ovaries bilaterally. Partial salpingectomy done bilaterally.   ANESTHESIA: Spinal INTRAVENOUS FLUIDS: 1300 ml ESTIMATED BLOOD LOSS: 700 ml URINE OUTPUT:  600 ml SPECIMENS: Placenta sent to L&D COMPLICATIONS: None immediate  PROCEDURE IN DETAIL:  The patient preoperatively received intravenous antibiotics and had sequential compression devices applied to her lower extremities.  She was then taken to the operating room where spinal anesthesia was administered and was found to be adequate. She was then placed in a dorsal supine position with a leftward tilt, and prepped and draped in a sterile manner.  A foley catheter was placed into her bladder and attached to constant gravity.  After an adequate timeout was performed, a Pfannenstiel skin incision was made with scalpel and carried through to the underlying layer of fascia. The fascia was incised in the midline, and this incision was extended bilaterally using the Mayo scissors.  Kocher clamps were applied to the superior aspect of the fascial incision and the underlying rectus muscles were dissected off bluntly. A similar process was carried out on the inferior aspect of the fascial incision. The rectus muscles were separated in the midline bluntly and the peritoneum was entered bluntly. Attention was turned to the lower uterine segment where a low transverse hysterotomy incision was made with a scalpel and extended bilaterally bluntly. Mayo scissors were used to extend the incision slightly due to the breech presentation.  The infant was successfully delivered from breech position in the usual fashion. The cord was clamped and cut and the infant was handed over to awaiting neonatology team. Uterine massage was then administered, and the placenta delivered intact with a three-vessel cord. The uterus was then cleared of clot and debris.  The hysterotomy was closed with 0 Vicryl in a running locked fashion, and an  imbricating layer was also placed with the same  suture.  Attention was then turned to the left fallopian tube, where the fimbrea were identified and a kelley clamp was used to clamp through the mesosalpinx. A haney stitch with 0 vicryl was done and the fimbriated end of the tube was removed. A similar process was carried out on the right side.   The uterus was then returned to the abdomen.  The pelvis was cleared of all clot and debris. Hemostasis was confirmed on all surfaces.  The peritoneum and the muscles were reapproximated using 0 Vicryl interrupted stitches. The fascia was then closed using 0 Vicryl in a running fashion.  The subcutaneous layer was irrigated, and the skin was closed with a 4-0 Vicryl subcuticular stitch.  30 cc of 0.5% marcaine was injected into the incision and benzoin and steristrips were applied.  The patient tolerated the procedure well. Sponge, lap, instrument and needle counts were correct x 2.  She was taken to the recovery room in stable condition.   Harvy Riera, Redmond Baseman, MD

## 2013-07-26 NOTE — MAU Note (Signed)
C/o ucs since midnight; scheduled for c-section in the morning @ 0800;

## 2013-07-26 NOTE — Transfer of Care (Signed)
Immediate Anesthesia Transfer of Care Note  Patient: Linda Costa  Procedure(s) Performed: Procedure(s): REPEAT CESAREAN SECTION WITH BILATERAL TUBAL LIGATION (Bilateral)  Patient Location: PACU  Anesthesia Type:Spinal  Level of Consciousness: awake  Airway & Oxygen Therapy: Patient Spontanous Breathing  Post-op Assessment: Report given to PACU RN  Post vital signs: Reviewed and stable  Complications: No apparent anesthesia complications

## 2013-07-26 NOTE — Anesthesia Procedure Notes (Signed)
Spinal  Patient location during procedure: OR Start time: 07/26/2013 8:52 PM End time: 07/26/2013 8:55 PM Reason for block: procedure for pain Staffing Anesthesiologist: Leilani AbleHATCHETT, Belma Dyches Performed by: anesthesiologist  Preanesthetic Checklist Completed: patient identified, surgical consent, pre-op evaluation, timeout performed, IV checked, risks and benefits discussed and monitors and equipment checked Spinal Block Patient position: sitting Prep: DuraPrep Patient monitoring: heart rate, cardiac monitor, continuous pulse ox and blood pressure Approach: midline Location: L3-4 Injection technique: single-shot Needle Needle type: Pencan  Needle gauge: 24 G Needle length: 9 cm Needle insertion depth: 6 cm Assessment Sensory level: T4

## 2013-07-27 DIAGNOSIS — Z98891 History of uterine scar from previous surgery: Secondary | ICD-10-CM

## 2013-07-27 LAB — CBC
HCT: 30.8 % — ABNORMAL LOW (ref 36.0–46.0)
Hemoglobin: 10.3 g/dL — ABNORMAL LOW (ref 12.0–15.0)
MCH: 30.2 pg (ref 26.0–34.0)
MCHC: 33.4 g/dL (ref 30.0–36.0)
MCV: 90.3 fL (ref 78.0–100.0)
Platelets: 142 10*3/uL — ABNORMAL LOW (ref 150–400)
RBC: 3.41 MIL/uL — ABNORMAL LOW (ref 3.87–5.11)
RDW: 13.9 % (ref 11.5–15.5)
WBC: 8.4 10*3/uL (ref 4.0–10.5)

## 2013-07-27 LAB — RPR: RPR Ser Ql: NONREACTIVE

## 2013-07-27 MED ORDER — MAGNESIUM HYDROXIDE 400 MG/5ML PO SUSP: 30.0000 mL | ORAL | Status: DC | PRN

## 2013-07-27 MED ORDER — PRENATAL MULTIVITAMIN CH
1.0000 | ORAL_TABLET | Freq: Every day | ORAL | Status: DC
  Administered 2013-07-27: 1 via ORAL
  Filled 2013-07-27: qty 1

## 2013-07-27 MED ORDER — DIBUCAINE 1 % RE OINT: 1.0000 "application " | TOPICAL_OINTMENT | RECTAL | Status: DC | PRN

## 2013-07-27 MED ORDER — IBUPROFEN 600 MG PO TABS: 600.0000 mg | ORAL_TABLET | Freq: Four times a day (QID) | ORAL | Status: DC | PRN

## 2013-07-27 MED ORDER — SIMETHICONE 80 MG PO CHEW
80.0000 mg | CHEWABLE_TABLET | Freq: Three times a day (TID) | ORAL | Status: DC
  Administered 2013-07-27 (×2): 80 mg via ORAL
  Filled 2013-07-27 (×2): qty 1

## 2013-07-27 MED ORDER — ONDANSETRON HCL 4 MG/2ML IJ SOLN: 4.0000 mg | Freq: Three times a day (TID) | INTRAMUSCULAR | Status: DC | PRN

## 2013-07-27 MED ORDER — NALBUPHINE HCL 10 MG/ML IJ SOLN: 5.0000 mg | INTRAMUSCULAR | Status: DC | PRN

## 2013-07-27 MED ORDER — LANOLIN HYDROUS EX OINT: 1.0000 "application " | TOPICAL_OINTMENT | CUTANEOUS | Status: DC | PRN

## 2013-07-27 MED ORDER — DIPHENHYDRAMINE HCL 50 MG/ML IJ SOLN
25.0000 mg | INTRAMUSCULAR | Status: DC | PRN
Start: 2013-07-27 — End: 2013-07-28

## 2013-07-27 MED ORDER — WITCH HAZEL-GLYCERIN EX PADS: 1.0000 "application " | MEDICATED_PAD | CUTANEOUS | Status: DC | PRN

## 2013-07-27 MED ORDER — OXYTOCIN 40 UNITS IN LACTATED RINGERS INFUSION - SIMPLE MED
62.5000 mL/h | INTRAVENOUS | Status: DC
Start: 2013-07-27 — End: 2013-07-27

## 2013-07-27 MED ORDER — SODIUM CHLORIDE 0.9 % IJ SOLN: 3.0000 mL | INTRAMUSCULAR | Status: DC | PRN

## 2013-07-27 MED ORDER — ZOLPIDEM TARTRATE 5 MG PO TABS
5.0000 mg | ORAL_TABLET | Freq: Every evening | ORAL | Status: DC | PRN
Start: 2013-07-27 — End: 2013-07-28

## 2013-07-27 MED ORDER — DIPHENHYDRAMINE HCL 25 MG PO CAPS: 25.0000 mg | ORAL_CAPSULE | Freq: Four times a day (QID) | ORAL | Status: DC | PRN

## 2013-07-27 MED ORDER — DIPHENHYDRAMINE HCL 50 MG/ML IJ SOLN
12.5000 mg | INTRAMUSCULAR | Status: DC | PRN
Start: 2013-07-27 — End: 2013-07-28

## 2013-07-27 MED ORDER — ONDANSETRON HCL 4 MG/2ML IJ SOLN
4.0000 mg | INTRAMUSCULAR | Status: DC | PRN
  Administered 2013-07-27: 4 mg via INTRAVENOUS
  Filled 2013-07-27: qty 2

## 2013-07-27 MED ORDER — NALOXONE HCL 0.4 MG/ML IJ SOLN
0.4000 mg | INTRAMUSCULAR | Status: DC | PRN
Start: 2013-07-27 — End: 2013-07-28

## 2013-07-27 MED ORDER — TETANUS-DIPHTH-ACELL PERTUSSIS 5-2.5-18.5 LF-MCG/0.5 IM SUSP: 0.5000 mL | Freq: Once | INTRAMUSCULAR | Status: DC

## 2013-07-27 MED ORDER — LACTATED RINGERS IV SOLN
INTRAVENOUS | Status: DC
  Administered 2013-07-27: 02:00:00 via INTRAVENOUS

## 2013-07-27 MED ORDER — METOCLOPRAMIDE HCL 5 MG/ML IJ SOLN: 10.0000 mg | Freq: Three times a day (TID) | INTRAMUSCULAR | Status: DC | PRN

## 2013-07-27 MED ORDER — NALOXONE HCL 1 MG/ML IJ SOLN
1.0000 ug/kg/h | INTRAVENOUS | Status: DC | PRN
  Filled 2013-07-27: qty 2

## 2013-07-27 MED ORDER — MENTHOL 3 MG MT LOZG: 1.0000 | LOZENGE | OROMUCOSAL | Status: DC | PRN

## 2013-07-27 MED ORDER — ONDANSETRON HCL 4 MG PO TABS: 4.0000 mg | ORAL_TABLET | ORAL | Status: DC | PRN

## 2013-07-27 MED ORDER — MEASLES, MUMPS & RUBELLA VAC ~~LOC~~ INJ
0.5000 mL | INJECTION | Freq: Once | SUBCUTANEOUS | Status: DC
  Filled 2013-07-27: qty 0.5

## 2013-07-27 MED ORDER — SIMETHICONE 80 MG PO CHEW: 80.0000 mg | CHEWABLE_TABLET | ORAL | Status: DC | PRN

## 2013-07-27 MED ORDER — DIPHENHYDRAMINE HCL 25 MG PO CAPS: 25.0000 mg | ORAL_CAPSULE | ORAL | Status: DC | PRN

## 2013-07-27 MED ORDER — PROMETHAZINE HCL 25 MG/ML IJ SOLN
25.0000 mg | Freq: Once | INTRAMUSCULAR | Status: AC
  Administered 2013-07-27: 25 mg via INTRAVENOUS
  Filled 2013-07-27: qty 1

## 2013-07-27 MED ORDER — IBUPROFEN 600 MG PO TABS
600.0000 mg | ORAL_TABLET | Freq: Four times a day (QID) | ORAL | Status: DC
  Administered 2013-07-27 – 2013-07-28 (×4): 600 mg via ORAL
  Filled 2013-07-27 (×5): qty 1

## 2013-07-27 MED ORDER — SENNOSIDES-DOCUSATE SODIUM 8.6-50 MG PO TABS
2.0000 | ORAL_TABLET | ORAL | Status: DC
  Administered 2013-07-27: 2 via ORAL
  Filled 2013-07-27: qty 2

## 2013-07-27 MED ORDER — OXYCODONE-ACETAMINOPHEN 5-325 MG PO TABS
1.0000 | ORAL_TABLET | ORAL | Status: DC | PRN
  Administered 2013-07-27: 1 via ORAL
  Filled 2013-07-27: qty 1

## 2013-07-27 MED ORDER — SIMETHICONE 80 MG PO CHEW
80.0000 mg | CHEWABLE_TABLET | ORAL | Status: DC
  Administered 2013-07-27: 80 mg via ORAL
  Filled 2013-07-27: qty 1

## 2013-07-27 NOTE — Progress Notes (Signed)
Post Partum Day 1 Subjective: no complaints, up ad lib, voiding, tolerating PO, + flatus and  nausea and vomiting has improved s/p Phenergan. Patient had tubal ligation. Breastfeeding supplementing with bottle.  Objective: Blood pressure 94/58, pulse 91, temperature 97.6 F (36.4 C), temperature source Oral, resp. rate 17, height 4\' 11"  (1.499 m), weight 68.04 kg (150 lb), last menstrual period 09/29/2012, SpO2 97.00%, unknown if currently breastfeeding.  Physical Exam:  General: alert, cooperative and appears stated age Lochia: appropriate Uterine Fundus: firm Incision: no significant drainage, no dehiscence, no significant erythema DVT Evaluation: No evidence of DVT seen on physical exam. Negative Homan's sign. No cords or calf tenderness. No significant calf/ankle edema.   Recent Labs  07/26/13 1740 07/27/13 0635  HGB 12.0 10.3*  HCT 35.7* 30.8*    Assessment/Plan: Plan for discharge tomorrow   LOS: 1 day   Wallis BambergMani, Mario 07/27/2013, 7:56 AM   I have seen and examined this patient and agree with above documentation in the resident's note. D/c foley today.  Plan to start food once more awake after phenergan.  Ambulate today as tolerated  Rulon AbideKeli Jaaliyah Lucatero, M.D. [facility] 07/27/2013 8:23 AM

## 2013-07-27 NOTE — Addendum Note (Signed)
Addendum created 07/27/13 0804 by Armanda Heritageharlesetta M Jalise Zawistowski, CRNA   Modules edited: Notes Section   Notes Section:  File: [id number]

## 2013-07-27 NOTE — Anesthesia Postprocedure Evaluation (Signed)
  Anesthesia Post-op Note  Patient: Linda Costa  Procedure(s) Performed: Procedure(s): REPEAT CESAREAN SECTION WITH BILATERAL TUBAL LIGATION (Bilateral)  Patient Location: PACU and Mother/Baby  Anesthesia Type:Spinal  Level of Consciousness: awake, alert  and oriented  Airway and Oxygen Therapy: Patient Spontanous Breathing  Post-op Pain: mild  Post-op Assessment: Patient's Cardiovascular Status Stable, Respiratory Function Stable, Patent Airway and No signs of Nausea or vomiting  Post-op Vital Signs: stable  Complications: No apparent anesthesia complications

## 2013-07-28 ENCOUNTER — Encounter (HOSPITAL_COMMUNITY): Payer: Self-pay | Admitting: Obstetrics & Gynecology

## 2013-07-28 LAB — BIRTH TISSUE RECOVERY COLLECTION (PLACENTA DONATION)

## 2013-07-28 MED ORDER — IBUPROFEN 600 MG PO TABS: 600.0000 mg | ORAL_TABLET | Freq: Four times a day (QID) | ORAL | Status: AC | PRN

## 2013-07-28 MED ORDER — OXYCODONE-ACETAMINOPHEN 5-325 MG PO TABS: 1.0000 | ORAL_TABLET | ORAL | Status: AC | PRN

## 2013-07-28 MED ORDER — SENNOSIDES-DOCUSATE SODIUM 8.6-50 MG PO TABS: 2.0000 | ORAL_TABLET | ORAL | Status: AC

## 2013-07-28 NOTE — Discharge Summary (Signed)
Obstetric Discharge Summary Reason for Admission: cesarean section Prenatal Procedures: none Intrapartum Procedures: cesarean: low cervical, transverse Postpartum Procedures: none Complications-Operative and Postpartum: none  Hospital Course: Linda Costa is a 38 y.o. [id number], [id number] s/p rLTCS and bilateral tubal ligation w/no complaints. Her nausea and vomiting is significantly improved since yesterday. She is up ad lib, tolerating PO, voiding and having soft bowel movements. She is breastfeeding supplementing with bottle. They do not wish to have their baby boy circumcised.  Plan is to d/c home today.  Delivery Note At 9:21 PM a healthy female was delivered via repeat C-Section, Low Transverse.  APGAR: 8, 8; weight 7 lb 15.9 oz (3625 g).   Placenta status: Intact, Manual removal.  Cord: 3 vessels.  Anesthesia: Spinal  Episiotomy:  Lacerations:  Suture Repair: vicryl Est. Blood Loss (mL): 700cc  Mom to postpartum.  Baby to Couplet care / Skin to Skin.  Linda Costa 07/28/2013, 7:54 AM     H/H: Lab Results  Component Value Date/Time   HGB 10.3* 07/27/2013  6:35 AM   HCT 30.8* 07/27/2013  6:35 AM    Filed Vitals:   07/27/13 2040  BP: 96/59  Pulse: 89  Temp: 97.9 F (36.6 C)  Resp: 18    Physical Exam: VSS NAD Abd: Appropriately tender, ND, Fundus @U -firm Incision: clean, without bleeding No c/c/e, Neg homan's sign, neg cords Lochia Appropriate  Discharge Diagnoses: Term Pregnancy-delivered  Discharge Information: Date: 11/01/2010 Activity: pelvic rest Diet: routine  Medications: None Breast feeding:  Yes Condition: stable Instructions: refer to handout Discharge to: home      Medication List         ibuprofen 600 MG tablet  Commonly known as:  ADVIL,MOTRIN  Take 1 tablet (600 mg total) by mouth every 6 (six) hours as needed for mild pain.     oxyCODONE-acetaminophen 5-325 MG per tablet  Commonly known as:  PERCOCET/ROXICET  Take 1-2 tablets by  mouth every 4 (four) hours as needed for severe pain (moderate - severe pain).     senna-docusate 8.6-50 MG per tablet  Commonly known as:  Senokot-S  Take 2 tablets by mouth daily.       Follow-up Information   [facility] [place] Schedule an appointment as soon as possible for a visit in 4 weeks.   Contact information:   [address] [place] [id number] [phone]      Linda Costa 07/28/2013,9:18 AM   I spoke with and examined patient and agree with PA-S's note and plan of care.  Linda ScaleMichael Ryan Rafeef Lau, MD Ob Fellow 07/28/2013 9:18 AM

## 2013-07-28 NOTE — Discharge Instructions (Signed)
Cuidados en el postparto luego de un parto por cesárea  °(Postpartum Care After Cesarean Delivery) °Después del parto (período de postparto), la estadía normal en el hospital es de 24-72 horas. Si hubo problemas con el trabajo de parto o el parto, o si tiene otros problemas médicos, es posible que deba permanecer en el hospital por más tiempo.  °Mientras esté en el hospital, recibirá ayuda e instrucciones sobre cómo cuidar de usted misma y de su bebé recién nacido durante el postparto.  °Mientras esté en el hospital:  °· Es normal que sienta dolor o molestias en la incisión en el abdomen. Asegúrese de decirle a las enfermeras si siente dolor, así como donde siente el dolor y qué empeora el dolor. °· Si está amamantando, puede sentir contracciones dolorosas en el útero durante algunas semanas. Esto es normal. Las contracciones ayudan a que el útero vuelva a su tamaño normal. °· Es normal tener algo de sangrado después del parto. °· Durante los primeros 1-3 días después del parto, el flujo es de color rojo y la cantidad puede ser similar a un período. °· Es frecuente que el flujo se inicie y se detenga. °· En los primeros días, puede eliminar algunos coágulos pequeños. Informe a las enfermeras si comienza a eliminar coágulos grandes o aumenta el flujo. °· No  elimine los coágulos de sangre por el inodoro antes de que la enfermera los vea. °· Durante los próximos 3 a 10 días después del parto, el flujo debe ser más acuoso y rosado o marrón. °· De diez a catorce días después del parto, el flujo debe ser una pequeña cantidad de secreción de color blanco amarillento. °· La cantidad de flujo disminuirá en las primeras semanas después del parto. El flujo puede detenerse en 6-8 semanas. La mayoría de las mujeres no tienen más flujo a las 12 semanas después del parto. °· Usted debe cambiar sus apósitos con frecuencia. °· Lávese bien las manos con agua y jabón durante al menos 20 segundos después de cambiar el apósito, usar el  baño o antes de sostener o alimentar a su recién nacido. °· Se le quitará la vía intravenosa (IV) cuando ya esté bebiendo suficientes líquidos. °· El tubo de drenaje para la orina (catéter urinario) que se inserta antes del parto puede ser retirado luego de 6-8 horas después del parto o cuando las piernas vuelvan a tener sensibilidad. Usted puede sentir que tiene que vaciar la vejiga durante las primeras 6-8 horas después de que le quiten el catéter. °· Si se siente débil, mareada o se desmaya, llame a su enfermera antes de levantarse de la cama por primera vez y antes de tomar una ducha por primera vez. °· En los primeros días después del parto, podrá sentir las mamas sensibles y llenas. Esto se llama congestión. La sensibilidad en los senos por lo general desaparece dentro de las 48-72 horas después de que ocurre la congestión. También puede notar que la leche se escapa de sus senos. Si no está amamantando no estimule sus pechos. La estimulación de las mamas hace que sus senos produzcan más leche. °· Pasar tanto tiempo como sea posible con el bebé recién nacido es muy importante. Durante este tiempo, usted y su bebé deben sentirse cerca y conocerse uno al otro. Tener al bebé en su habitación (alojamiento conjunto) ayudará a fortalecer el vínculo con el bebé recién nacido. Esto le dará tiempo para conocerlo y atenderlo de manera cómoda. °· Las hormonas se modifican después del parto. A   veces, los cambios hormonales pueden causar tristeza o ganas de llorar por un tiempo. Estos sentimientos no deben durar más de unos pocos días. Si duran más que eso, debe hablar con su médico. °· Si lo desea, hable con su médico acerca de los métodos de planificación familiar o métodos anticonceptivos. °· Hable con su médico acerca de las vacunas. El médico puede indicarle que se aplique las siguientes vacunas antes de salir del hospital: °· Vacuna contra el tétanos, la difteria y la tos ferina (Tdap) o el tétanos y la difteria (Td).  Es muy importante que usted y su familia (incluyendo a los abuelos) u otras personas que cuidan al recién nacido estén al día con las vacunas Tdap o Td. Las vacunas Tdap o Td pueden ayudar a proteger al recién nacido de enfermedades. °· Inmunización contra la rubéola. °· Inmunización contra la varicela. °· Inmunización contra la gripe. Usted debe recibir esta vacunación anual si no la ha recibido durante el embarazo. °Document Released: 03/25/2012 °[employer]® Patient Information ©2014 [employer]. ° °

## 2013-07-28 NOTE — Discharge Summary (Signed)
Agree with above note.  Levie HeritageJacob J Lynisha Osuch, DO 07/28/2013 9:47 AM

## 2013-09-08 ENCOUNTER — Ambulatory Visit: Payer: Medicaid Other | Admitting: Obstetrics & Gynecology

## 2014-02-21 ENCOUNTER — Encounter (HOSPITAL_COMMUNITY): Payer: Self-pay | Admitting: Obstetrics & Gynecology

## 2014-11-24 IMAGING — US US TRANSVAGINAL NON-OB
1 series · 14 of 25 positions shown · non-contrast
Comparison: None

CLINICAL DATA: Pelvic pain



[Series 1: us transvaginal non-ob · 0.23mm/px · 14 of 70 slices shown]
[im 1/70]
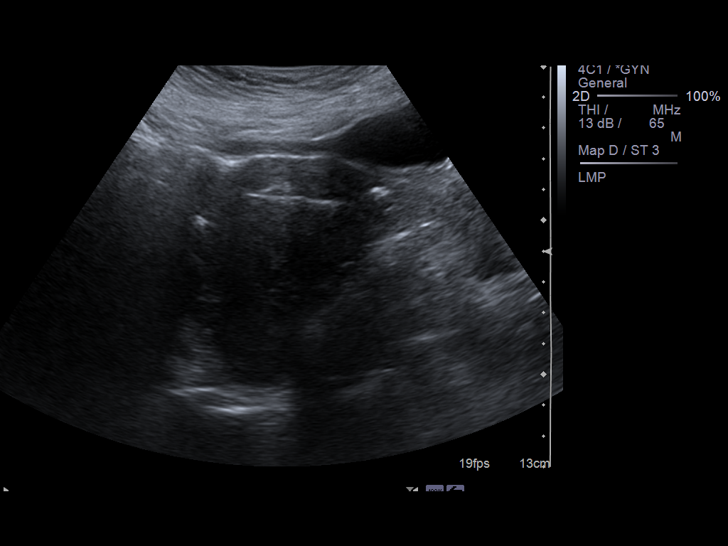
[im 6/70]
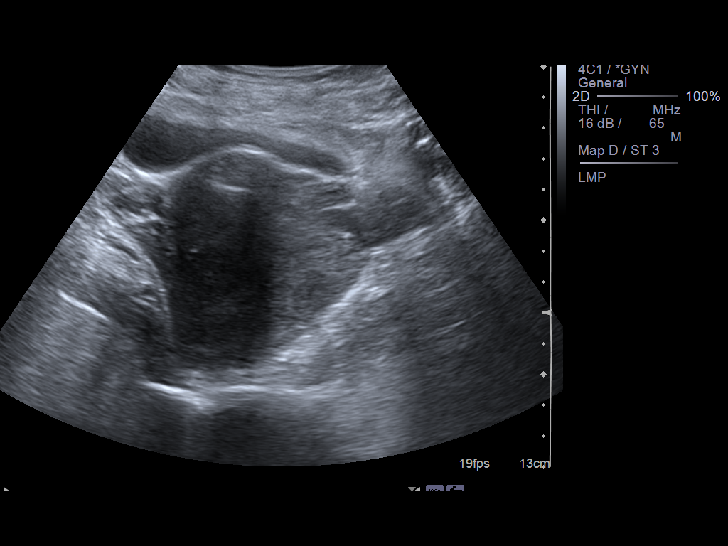
[im 12/70]
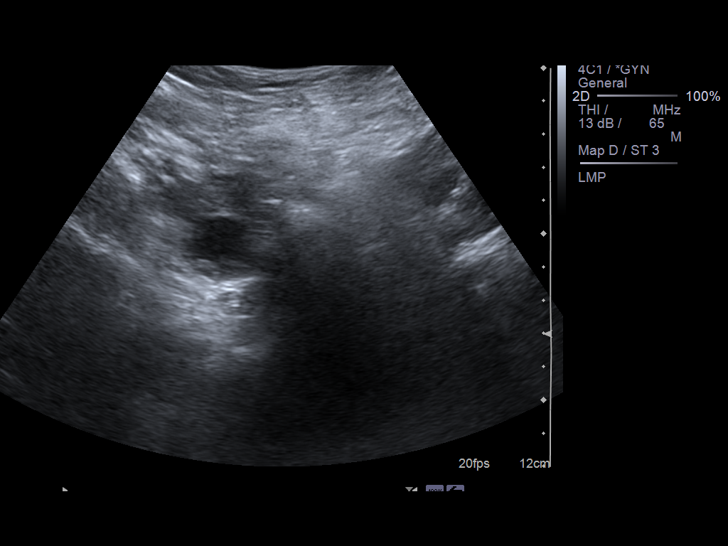
[im 18/70]
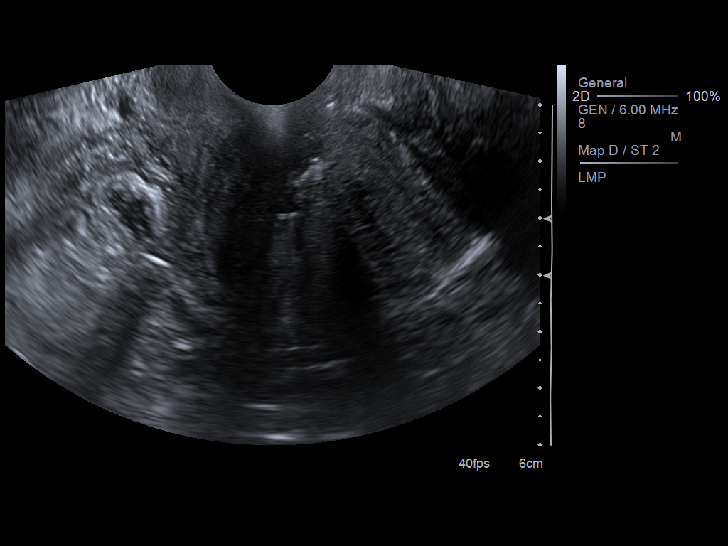
[im 24/70]
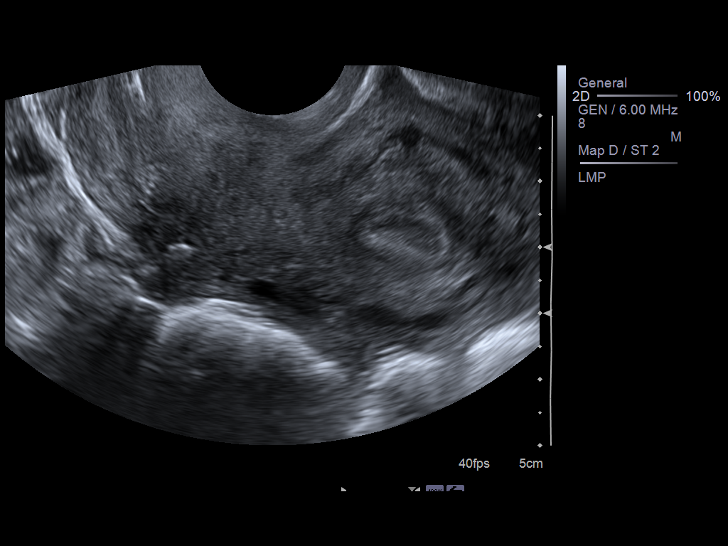
[im 26/70]
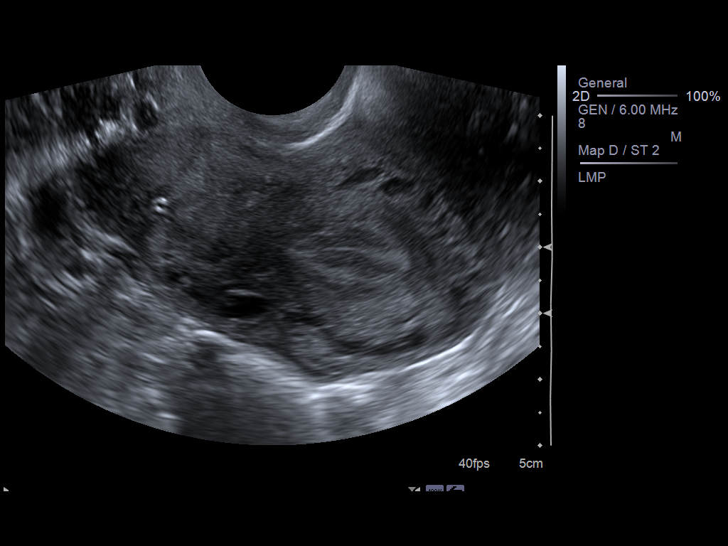
[im 32/70]
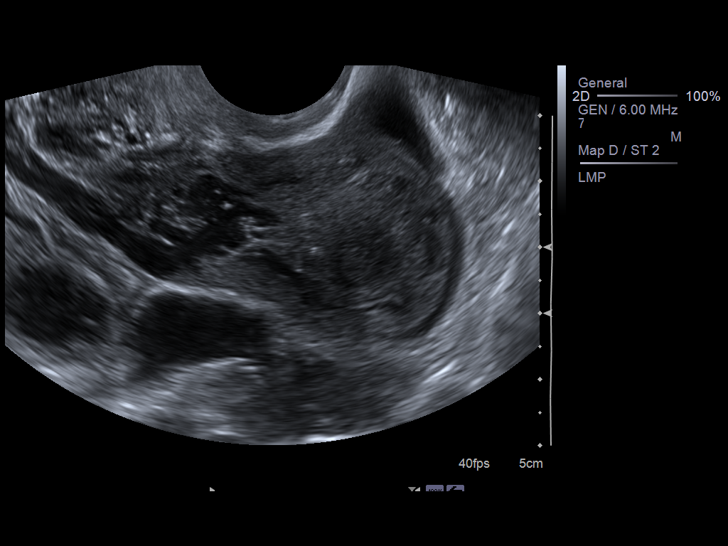
[im 38/70]
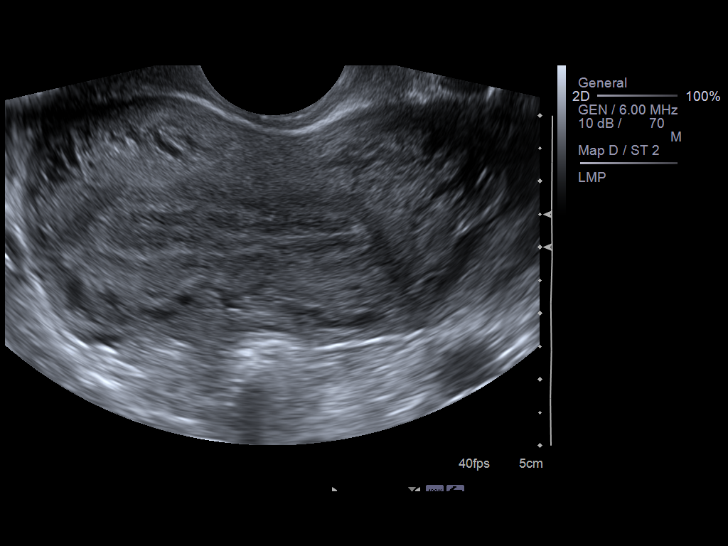
[im 44/70]
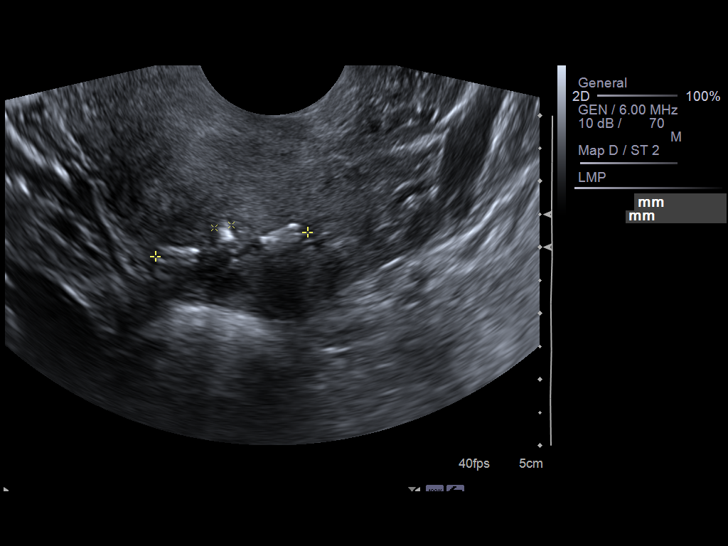
[im 47/70]
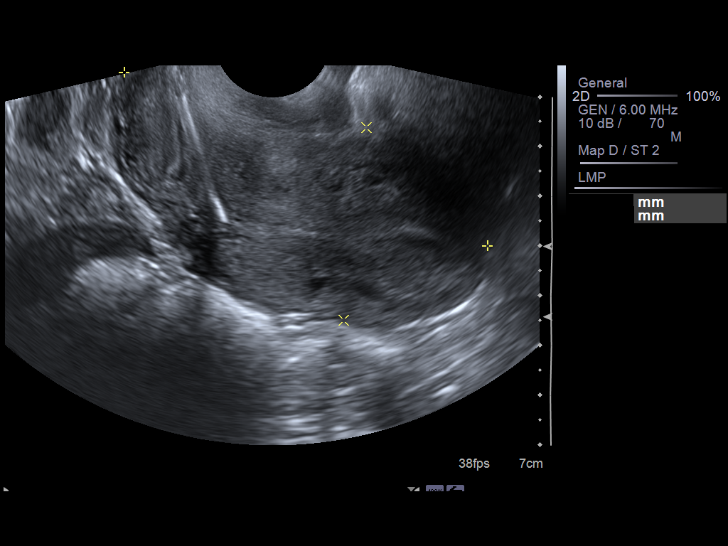
[im 52/70]
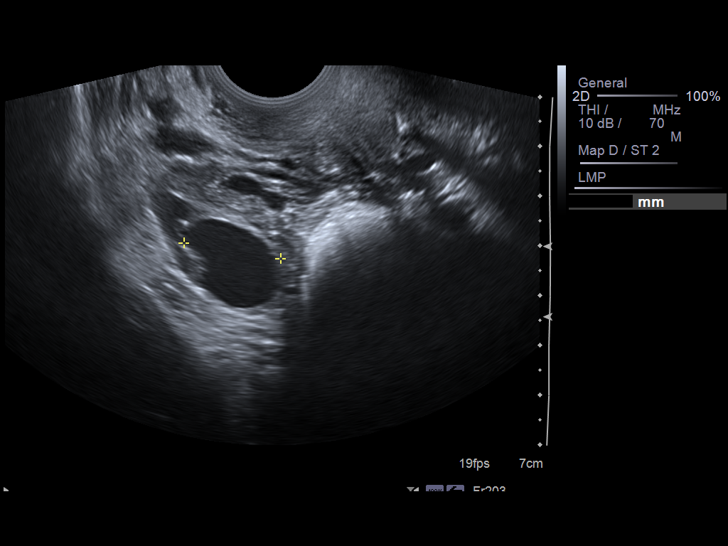
[im 58/70]
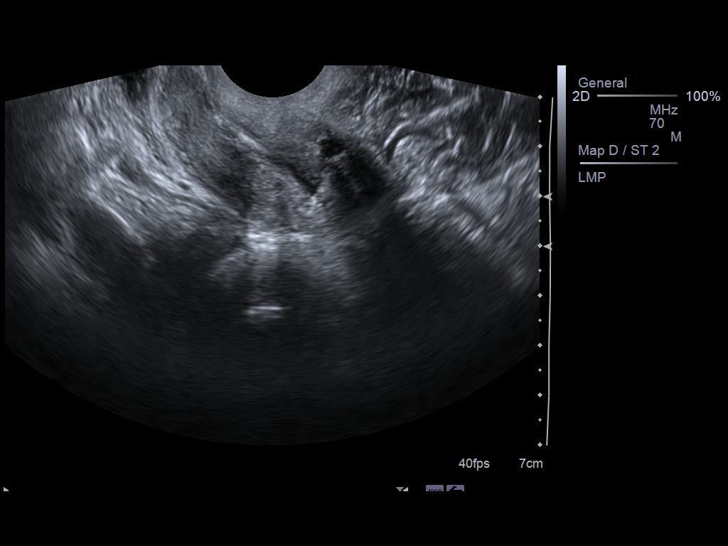
[im 64/70]
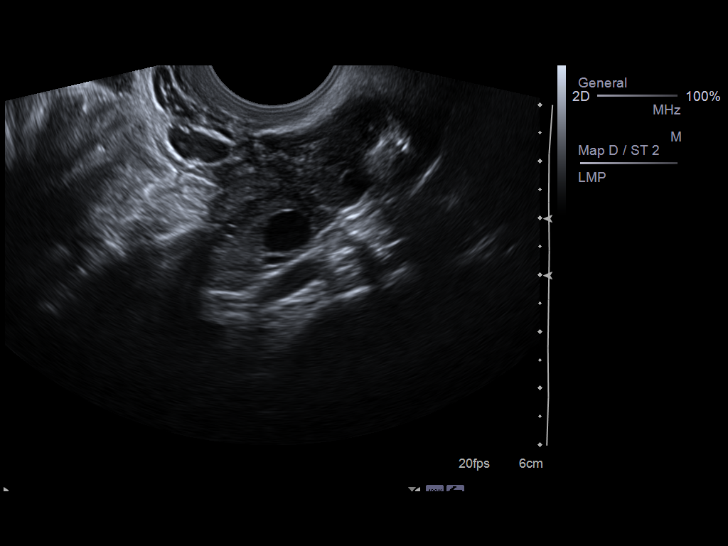
[im 70/70]
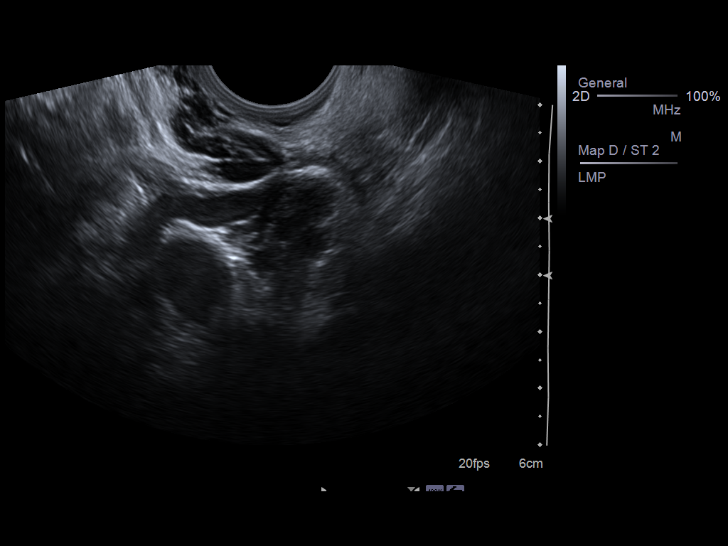

[14 of 25 positions shown; findings below may reference images not displayed]

FINDINGS: Uterus: Normal in size, measuring 8.1 x 3.9 x 7.1 cm.

Endometrium: Measures 2 mm in thickness.  Low-lying IUD within the
lower uterine segment and extending into the cervix.

Right ovary:  Measures 3.1 x 2.1 x 3.5 cm and is notable for a
x 1.8 x 2.0 cm hemorrhagic/corpus luteal cyst.

Left ovary: Normal appearance/no adnexal mass, measuring 2.7 x
x 1.4 cm.

Other findings: Trace fluid in the pelvic cul-de-sac.
IMPRESSION: Low-lying IUD within the lower uterine segment/cervix.

2.3 cm hemorrhagic right corpus luteal cyst.
